# Patient Record
Sex: Female | Born: 1995 | Race: White | Hispanic: No | Marital: Single | Smoking: Never smoker
Health system: Southern US, Community
[De-identification: ages and names within clinical notes are randomized; demographics above are authoritative.]

## PROBLEM LIST (undated history)

## (undated) DIAGNOSIS — F329 Major depressive disorder, single episode, unspecified: Secondary | ICD-10-CM

## (undated) DIAGNOSIS — F419 Anxiety disorder, unspecified: Secondary | ICD-10-CM

## (undated) DIAGNOSIS — R569 Unspecified convulsions: Secondary | ICD-10-CM

## (undated) DIAGNOSIS — F32A Depression, unspecified: Secondary | ICD-10-CM

## (undated) HISTORY — DX: Depression, unspecified: F32.A

## (undated) HISTORY — DX: Anxiety disorder, unspecified: F41.9

## (undated) HISTORY — DX: Major depressive disorder, single episode, unspecified: F32.9

## (undated) HISTORY — DX: Unspecified convulsions: R56.9

---
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
## 2015-04-01 ENCOUNTER — Ambulatory Visit (INDEPENDENT_AMBULATORY_CARE_PROVIDER_SITE_OTHER): Payer: BLUE CROSS/BLUE SHIELD | Admitting: Urgent Care

## 2015-04-01 VITALS — BP 94/54 | HR 74 | Temp 98.2°F | Resp 14 | Ht 66.5 in | Wt 116.4 lb

## 2015-04-01 DIAGNOSIS — F411 Generalized anxiety disorder: Secondary | ICD-10-CM | POA: Diagnosis not present

## 2015-04-01 MED ORDER — BUSPIRONE HCL 5 MG PO TABS: 5.0000 mg | ORAL_TABLET | Freq: Two times a day (BID) | ORAL | Status: AC

## 2015-04-01 MED ORDER — BUSPIRONE HCL 10 MG PO TABS: 10.0000 mg | ORAL_TABLET | Freq: Two times a day (BID) | ORAL | Status: AC

## 2015-04-01 MED ORDER — BUSPIRONE HCL 15 MG PO TABS: 15.0000 mg | ORAL_TABLET | Freq: Two times a day (BID) | ORAL | Status: DC

## 2015-04-01 NOTE — Progress Notes (Signed)
    MRN: [id number] DOB: 11-Feb-1995  Subjective:   Marilyn Reynolds is a 20 y.o. female presenting for chief complaint of Medication Management  Patient is presenting as to establish care with Dr. Katrinka BlazingSmith. However, due to long wait, she wanted to get medications started with me and will f/u with Dr. Katrinka BlazingSmith. Patient has a longstanding history of anxiety. She had to take leave from [employer] for [facility] semester and was conditional upon her seeing a therapist weekly. Since returning home, [facility], working with [employer] Gordon[employer]Cole for ~1.5 months now. Patient presents a letter from [employer] today, would like to start Buspar on a specific regimen. She has previously tried Lexapro for ~3 months, failed this trial due to side effects of dizziness and fatigue. Admits having history of IBS, sensitivity to medications. Denies depression, thoughts about death. Patient has a good support network with her boyfriend and family friends. She [place].   Marilyn Reynolds has a current medication list which includes the following prescription(s): doxycycline. Also has No Known Allergies.  Marilyn Reynolds  has a past medical history of Anxiety; Depression; and Seizures (HCC). Also  has no past surgical history on file.  Objective:   Vitals: BP 94/54 mmHg  Pulse 74  Temp(Src) 98.2 F (36.8 C) (Oral)  Resp 14  Ht 5' 6.5" (1.689 m)  Wt 116 lb 6.4 oz (52.799 kg)  BMI 18.51 kg/m2  SpO2 96%  Physical Exam  Constitutional: She is oriented to person, place, and time. She appears well-developed and well-nourished.  HENT:  Mouth/Throat: Oropharynx is clear and moist.  Eyes: Pupils are equal, round, and reactive to light. No scleral icterus.  Neck: Normal range of motion. Neck supple. No thyromegaly present.  Cardiovascular: Normal rate, regular rhythm and intact distal pulses.  Exam reveals no gallop and no friction rub.   No murmur  heard. Pulmonary/Chest: No respiratory distress. She has no wheezes. She has no rales.  Neurological: She is alert and oriented to person, place, and time.  Skin: Skin is warm and dry.  Psychiatric: She has a normal mood and affect.   Assessment and Plan :   1. Generalized anxiety disorder - I discussed treatment options for anxiety including SSRIs, Buspar. Patient verbalized understanding and would like to proceed with the plan as developed by her therapist. She will start Buspar 5 mg BID x1 week. Then increase to 10mg  BID x1 week. Then go up to 15mg  BID. I recommended f/u with Dr. Katrinka BlazingSmith in 4-6 weeks or sooner if necessary.  Wallis BambergMario Mitchelle Sultan, PA-C [facility] [phone] 04/01/2015 6:11 PM

## 2015-04-01 NOTE — Patient Instructions (Addendum)
IF you received an x-ray today, you will receive an invoice from [facility]. Please contact [facility] at [phone] with questions or concerns regarding your invoice.   IF you received labwork today, you will receive an invoice from [facility]. Please contact [facility] at [phone] with questions or concerns regarding your invoice.   Our billing staff will not be able to assist you with questions regarding bills from these companies.  You will be contacted with the lab results as soon as they are available. The fastest way to get your results is to activate your My Chart account. Instructions are located on the last page of this paperwork. If you have not heard from us regarding the results in 2 weeks, please contact this office.    Generalized Anxiety Disorder Generalized anxiety disorder (GAD) is a mental disorder. It interferes with life functions, including relationships, work, and school. GAD is different from normal anxiety, which everyone experiences at some point in their lives in response to specific life events and activities. Normal anxiety actually helps us prepare for and get through these life events and activities. Normal anxiety goes away after the event or activity is over.  GAD causes anxiety that is not necessarily related to specific events or activities. It also causes excess anxiety in proportion to specific events or activities. The anxiety associated with GAD is also difficult to control. GAD can vary from mild to severe. People with severe GAD can have intense waves of anxiety with physical symptoms (panic attacks).  SYMPTOMS The anxiety and worry associated with GAD are difficult to control. This anxiety and worry are related to many life events and activities and also occur more days than not for 6 months or longer. People with GAD also have three or more of the following symptoms (one or more in children):  Restlessness.    Fatigue.  Difficulty concentrating.   Irritability.  Muscle tension.  Difficulty sleeping or unsatisfying sleep. DIAGNOSIS GAD is diagnosed through an assessment by your health care provider. Your health care provider will ask you questions aboutyour mood,physical symptoms, and events in your life. Your health care provider may ask you about your medical history and use of alcohol or drugs, including prescription medicines. Your health care provider may also do a physical exam and blood tests. Certain medical conditions and the use of certain substances can cause symptoms similar to those associated with GAD. Your health care provider may refer you to a mental health specialist for further evaluation. TREATMENT The following therapies are usually used to treat GAD:   Medication. Antidepressant medication usually is prescribed for long-term daily control. Antianxiety medicines may be added in severe cases, especially when panic attacks occur.   Talk therapy (psychotherapy). Certain types of talk therapy can be helpful in treating GAD by providing support, education, and guidance. A form of talk therapy called cognitive behavioral therapy can teach you healthy ways to think about and react to daily life events and activities.  Stress managementtechniques. These include yoga, meditation, and exercise and can be very helpful when they are practiced regularly. A mental health specialist can help determine which treatment is best for you. Some people see improvement with one therapy. However, other people require a combination of therapies.   This information is not intended to replace advice given to you by your health care provider. Make sure you discuss any questions you have with your health care provider.   Document Released: 04/30/2012 Document Revised: 01/24/2014 Document  Reviewed: 04/30/2012 Elsevier Interactive Patient Education 2016 Elsevier Inc.    Buspirone tablets What  is this medicine? BUSPIRONE (byoo SPYE rone) is used to treat anxiety disorders. This medicine may be used for other purposes; ask your health care provider or pharmacist if you have questions. What should I tell my health care provider before I take this medicine? They need to know if you have any of these conditions: -kidney or liver disease -an unusual or allergic reaction to buspirone, other medicines, foods, dyes, or preservatives -pregnant or trying to get pregnant -breast-feeding How should I use this medicine? Take this medicine by mouth with a glass of water. Follow the directions on the prescription label. You may take this medicine with or without food. To ensure that this medicine always works the same way for you, you should take it either always with or always without food. Take your doses at regular intervals. Do not take your medicine more often than directed. Do not stop taking except on the advice of your doctor or health care professional. Talk to your pediatrician regarding the use of this medicine in children. Special care may be needed. Overdosage: If you think you have taken too much of this medicine contact a poison control center or emergency room at once. NOTE: This medicine is only for you. Do not share this medicine with others. What if I miss a dose? If you miss a dose, take it as soon as you can. If it is almost time for your next dose, take only that dose. Do not take double or extra doses. What may interact with this medicine? Do not take this medicine with any of the following medications: -linezolid -MAOIs like Carbex, Eldepryl, Marplan, Nardil, and Parnate -methylene blue -procarbazine This medicine may also interact with the following medications: -diazepam -digoxin -diltiazem -erythromycin -grapefruit juice -haloperidol -medicines for mental depression or mood problems -medicines for seizures like carbamazepine, phenobarbital and  phenytoin -nefazodone -other medications for anxiety -rifampin -ritonavir -some antifungal medicines like itraconazole, ketoconazole, and voriconazole -verapamil -warfarin This list may not describe all possible interactions. Give your health care provider a list of all the medicines, herbs, non-prescription drugs, or dietary supplements you use. Also tell them if you smoke, drink alcohol, or use illegal drugs. Some items may interact with your medicine. What should I watch for while using this medicine? Visit your doctor or health care professional for regular checks on your progress. It may take 1 to 2 weeks before your anxiety gets better. You may get drowsy or dizzy. Do not drive, use machinery, or do anything that needs mental alertness until you know how this drug affects you. Do not stand or sit up quickly, especially if you are an older patient. This reduces the risk of dizzy or fainting spells. Alcohol can make you more drowsy and dizzy. Avoid alcoholic drinks. What side effects may I notice from receiving this medicine? Side effects that you should report to your doctor or health care professional as soon as possible: -blurred vision or other vision changes -chest pain -confusion -difficulty breathing -feelings of hostility or anger -muscle aches and pains -numbness or tingling in hands or feet -ringing in the ears -skin rash and itching -vomiting -weakness Side effects that usually do not require medical attention (report to your doctor or health care professional if they continue or are bothersome): -disturbed dreams, nightmares -headache -nausea -restlessness or nervousness -sore throat and nasal congestion -stomach upset This list may not describe all possible side  effects. Call your doctor for medical advice about side effects. You may report side effects to FDA at 1-800-FDA-1088. Where should I keep my medicine? Keep out of the reach of children. Store at room  temperature below 30 degrees C (86 degrees F). Protect from light. Keep container tightly closed. Throw away any unused medicine after the expiration date. NOTE: This sheet is a summary. It may not cover all possible information. If you have questions about this medicine, talk to your doctor, pharmacist, or health care provider.    2016, Elsevier/Gold Standard. (2009-08-13 18:06:11)

## 2015-04-04 ENCOUNTER — Ambulatory Visit (INDEPENDENT_AMBULATORY_CARE_PROVIDER_SITE_OTHER): Payer: BLUE CROSS/BLUE SHIELD | Admitting: Physician Assistant

## 2015-04-04 VITALS — BP 103/63 | HR 68 | Temp 98.0°F | Resp 16 | Ht 66.5 in | Wt 110.0 lb

## 2015-04-04 DIAGNOSIS — K5900 Constipation, unspecified: Secondary | ICD-10-CM | POA: Diagnosis not present

## 2015-04-04 MED ORDER — MINERAL OIL RE ENEM: 1.0000 | ENEMA | Freq: Once | RECTAL | Status: AC

## 2015-04-04 NOTE — Patient Instructions (Signed)
     IF you received an x-ray today, you will receive an invoice from [facility]. Please contact [facility] at [phone] with questions or concerns regarding your invoice.   IF you received labwork today, you will receive an invoice from [facility]. Please contact [facility] at [phone] with questions or concerns regarding your invoice.   Our billing staff will not be able to assist you with questions regarding bills from these companies.  You will be contacted with the lab results as soon as they are available. The fastest way to get your results is to activate your My Chart account. Instructions are located on the last page of this paperwork. If you have not heard from us regarding the results in 2 weeks, please contact this office.      

## 2015-04-04 NOTE — Progress Notes (Signed)
   04/04/2015 4:32 PM   DOB: 1995/03/20 / MRN: [id number]  SUBJECTIVE:  Marilyn Reynolds is a 20 y.o. female presenting for constipation times one week.  She associates belly pain.  Has been trying colace without relief.  She has a history of IBS diagnosed during childhood and has an appointment with Dr. Loreta AveMann in 3 days.    She has No Known Allergies.   She  has a past medical history of Anxiety; Depression; and Seizures (HCC).    She  reports that she has never smoked. She does not have any smokeless tobacco history on file. She reports that she does not drink alcohol or use illicit drugs. She  has no sexual activity history on file. The patient  has no past surgical history on file.  Her family history includes Hyperlipidemia in her father; Hypertension in her father and mother; Stroke in her mother.  Review of Systems  Constitutional: Negative for fever.  Cardiovascular: Negative for chest pain.  Gastrointestinal: Positive for abdominal pain and constipation.  Genitourinary: Negative for dysuria.  Skin: Negative for rash.  Neurological: Negative for dizziness and headaches.    Problem list and medications reviewed and updated by myself where necessary, and exist elsewhere in the encounter.   OBJECTIVE:  BP 103/63 mmHg  Pulse 68  Temp(Src) 98 F (36.7 C) (Oral)  Resp 16  Ht 5' 6.5" (1.689 m)  Wt 110 lb (49.896 kg)  BMI 17.49 kg/m2  SpO2 100%  LMP 02/16/2015  Physical Exam  Constitutional: She is oriented to person, place, and time. She appears well-nourished. No distress.  Eyes: EOM are normal. Pupils are equal, round, and reactive to light.  Cardiovascular: Normal rate.   Pulmonary/Chest: Effort normal.  Abdominal: Soft. Bowel sounds are normal. She exhibits no distension and no mass. There is no tenderness. There is no rebound and no guarding.  Neurological: She is alert and oriented to person, place, and time. No cranial nerve deficit. Gait normal.  Skin: Skin is dry. She  is not diaphoretic.  Psychiatric: She has a normal mood and affect.  Vitals reviewed.   No results found for this or any previous visit (from the past 72 hour(s)).  No results found.  ASSESSMENT AND PLAN  Zollie ScaleOlivia was seen today for abdominal pain, constipation and nausea.  Diagnoses and all orders for this visit:  Constipation, unspecified constipation type: She is well appearing and has a normal belly exam. Discussed Miralax clean out vs. Mineral oil enema.  She opted for mineral oil.  Will send to pharm.  -     mineral oil ([facility] MINERAL OIL ENEMA) enema; Place 133 mLs (1 enema total) rectally once.    The patient was advised to call or return to clinic if she does not see an improvement in symptoms or to seek the care of the closest emergency department if she worsens with the above plan.   Deliah BostonMichael Cerria Randhawa, MHS, PA-C [facility] 04/04/2015 4:32 PM

## 2015-08-03 ENCOUNTER — Other Ambulatory Visit: Payer: Self-pay | Admitting: Urgent Care
